# Patient Record
Sex: Male | Born: 1970 | Race: White | Hispanic: No | Marital: Married | Smoking: Never smoker
Health system: Southern US, Community
[De-identification: ages and names within clinical notes are randomized; demographics above are authoritative.]

## PROBLEM LIST (undated history)

## (undated) DIAGNOSIS — R109 Unspecified abdominal pain: Secondary | ICD-10-CM

## (undated) DIAGNOSIS — K219 Gastro-esophageal reflux disease without esophagitis: Secondary | ICD-10-CM

## (undated) HISTORY — PX: CHOLECYSTECTOMY: SHX55

---

## 1998-09-05 ENCOUNTER — Ambulatory Visit (HOSPITAL_COMMUNITY): Admission: RE | Admit: 1998-09-05 | Discharge: 1998-09-05 | Payer: Self-pay | Admitting: Gastroenterology

## 2007-11-17 ENCOUNTER — Encounter: Admission: RE | Admit: 2007-11-17 | Discharge: 2007-11-17 | Payer: Self-pay | Admitting: Gastroenterology

## 2009-08-05 ENCOUNTER — Emergency Department (HOSPITAL_BASED_OUTPATIENT_CLINIC_OR_DEPARTMENT_OTHER): Admission: EM | Admit: 2009-08-05 | Discharge: 2009-08-05 | Payer: Self-pay | Admitting: Emergency Medicine

## 2010-07-04 ENCOUNTER — Encounter: Admission: RE | Admit: 2010-07-04 | Discharge: 2010-07-04 | Payer: Self-pay | Admitting: Gastroenterology

## 2010-08-14 ENCOUNTER — Ambulatory Visit (HOSPITAL_COMMUNITY): Admission: RE | Admit: 2010-08-14 | Discharge: 2010-08-14 | Payer: Self-pay | Admitting: General Surgery

## 2011-01-25 ENCOUNTER — Other Ambulatory Visit (INDEPENDENT_AMBULATORY_CARE_PROVIDER_SITE_OTHER): Payer: Self-pay

## 2011-01-25 DIAGNOSIS — R0989 Other specified symptoms and signs involving the circulatory and respiratory systems: Secondary | ICD-10-CM

## 2011-03-08 LAB — CBC
HCT: 41.6 % (ref 39.0–52.0)
Hemoglobin: 14.6 g/dL (ref 13.0–17.0)
MCHC: 35.1 g/dL (ref 30.0–36.0)
Platelets: 203 10*3/uL (ref 150–400)
RBC: 4.63 MIL/uL (ref 4.22–5.81)
RDW: 12.8 % (ref 11.5–15.5)
WBC: 5.5 10*3/uL (ref 4.0–10.5)

## 2011-03-08 LAB — COMPREHENSIVE METABOLIC PANEL
Albumin: 4.4 g/dL (ref 3.5–5.2)
Alkaline Phosphatase: 42 U/L (ref 39–117)
CO2: 30 mEq/L (ref 19–32)
Calcium: 9.5 mg/dL (ref 8.4–10.5)
Creatinine, Ser: 0.75 mg/dL (ref 0.4–1.5)
Sodium: 139 mEq/L (ref 135–145)
Total Bilirubin: 1.6 mg/dL — ABNORMAL HIGH (ref 0.3–1.2)

## 2011-03-08 LAB — DIFFERENTIAL
Basophils Absolute: 0 10*3/uL (ref 0.0–0.1)
Eosinophils Relative: 1 % (ref 0–5)
Lymphocytes Relative: 39 % (ref 12–46)
Monocytes Relative: 10 % (ref 3–12)
Neutro Abs: 2.7 10*3/uL (ref 1.7–7.7)
Neutrophils Relative %: 49 % (ref 43–77)

## 2011-03-08 LAB — SURGICAL PCR SCREEN: MRSA, PCR: NEGATIVE

## 2011-05-10 ENCOUNTER — Encounter (INDEPENDENT_AMBULATORY_CARE_PROVIDER_SITE_OTHER): Payer: Self-pay | Admitting: *Deleted

## 2011-05-10 ENCOUNTER — Encounter: Payer: Self-pay | Admitting: Cardiology

## 2011-05-31 ENCOUNTER — Ambulatory Visit (HOSPITAL_COMMUNITY)
Admission: RE | Admit: 2011-05-31 | Discharge: 2011-05-31 | Disposition: A | Discharge status: Home / Self Care | Payer: Worker's Compensation | Source: Ambulatory Visit | Attending: Orthopedic Surgery | Admitting: Orthopedic Surgery

## 2011-05-31 ENCOUNTER — Other Ambulatory Visit (HOSPITAL_COMMUNITY): Payer: Self-pay | Admitting: Orthopedic Surgery

## 2011-05-31 DIAGNOSIS — Z77018 Contact with and (suspected) exposure to other hazardous metals: Secondary | ICD-10-CM

## 2011-05-31 DIAGNOSIS — Z1389 Encounter for screening for other disorder: Secondary | ICD-10-CM | POA: Insufficient documentation

## 2011-11-12 ENCOUNTER — Other Ambulatory Visit: Payer: Self-pay | Admitting: Gastroenterology

## 2011-11-12 DIAGNOSIS — R1011 Right upper quadrant pain: Secondary | ICD-10-CM

## 2011-11-19 ENCOUNTER — Ambulatory Visit
Admission: RE | Admit: 2011-11-19 | Discharge: 2011-11-19 | Disposition: A | Discharge status: Home / Self Care | Payer: 59 | Source: Ambulatory Visit | Attending: Gastroenterology | Admitting: Gastroenterology

## 2011-11-19 DIAGNOSIS — R1011 Right upper quadrant pain: Secondary | ICD-10-CM

## 2011-12-04 ENCOUNTER — Ambulatory Visit (HOSPITAL_COMMUNITY)
Admission: RE | Admit: 2011-12-04 | Discharge: 2011-12-04 | Disposition: A | Discharge status: Home / Self Care | Payer: 59 | Source: Ambulatory Visit | Attending: Gastroenterology | Admitting: Gastroenterology

## 2011-12-04 ENCOUNTER — Ambulatory Visit (HOSPITAL_COMMUNITY): Payer: 59 | Admitting: Anesthesiology

## 2011-12-04 ENCOUNTER — Encounter (HOSPITAL_COMMUNITY): Payer: Self-pay | Admitting: Anesthesiology

## 2011-12-04 ENCOUNTER — Encounter (HOSPITAL_COMMUNITY): Admission: RE | Disposition: A | Discharge status: Home / Self Care | Payer: Self-pay | Source: Ambulatory Visit

## 2011-12-04 ENCOUNTER — Encounter (HOSPITAL_COMMUNITY): Payer: Self-pay | Admitting: *Deleted

## 2011-12-04 DIAGNOSIS — R1011 Right upper quadrant pain: Secondary | ICD-10-CM | POA: Insufficient documentation

## 2011-12-04 DIAGNOSIS — R17 Unspecified jaundice: Secondary | ICD-10-CM | POA: Insufficient documentation

## 2011-12-04 DIAGNOSIS — Z9089 Acquired absence of other organs: Secondary | ICD-10-CM | POA: Insufficient documentation

## 2011-12-04 HISTORY — DX: Gastro-esophageal reflux disease without esophagitis: K21.9

## 2011-12-04 HISTORY — PX: EUS: SHX5427

## 2011-12-04 SURGERY — ESOPHAGEAL ENDOSCOPIC ULTRASOUND (EUS) RADIAL
Anesthesia: Monitor Anesthesia Care

## 2011-12-04 MED ORDER — MIDAZOLAM HCL 5 MG/5ML IJ SOLN
INTRAMUSCULAR | Status: DC | PRN
  Administered 2011-12-04: 1 mg via INTRAVENOUS
  Administered 2011-12-04: 2 mg via INTRAVENOUS
  Administered 2011-12-04: 1 mg via INTRAVENOUS

## 2011-12-04 MED ORDER — PROPOFOL 10 MG/ML IV EMUL
INTRAVENOUS | Status: DC | PRN
  Administered 2011-12-04: 120 ug/kg/min via INTRAVENOUS

## 2011-12-04 MED ORDER — LACTATED RINGERS IV SOLN
INTRAVENOUS | Status: DC
  Administered 2011-12-04: 1000 mL via INTRAVENOUS

## 2011-12-04 MED ORDER — FENTANYL CITRATE 0.05 MG/ML IJ SOLN
INTRAMUSCULAR | Status: DC | PRN
  Administered 2011-12-04: 100 ug via INTRAVENOUS

## 2011-12-04 MED ORDER — KETAMINE HCL 10 MG/ML IJ SOLN
INTRAMUSCULAR | Status: DC | PRN
  Administered 2011-12-04 (×2): 10 mg via INTRAVENOUS

## 2011-12-04 NOTE — Anesthesia Postprocedure Evaluation (Signed)
  Anesthesia Post-op Note  Patient: Joe Hunt  Procedure(s) Performed:  ESOPHAGEAL ENDOSCOPIC ULTRASOUND (EUS) RADIAL  Patient Location: PACU  Anesthesia Type: MAC  Level of Consciousness: awake and alert   Airway and Oxygen Therapy: Patient Spontanous Breathing  Post-op Pain: mild  Post-op Assessment: Post-op Vital signs reviewed, Patient's Cardiovascular Status Stable, Respiratory Function Stable, Patent Airway and No signs of Nausea or vomiting  Post-op Vital Signs: stable  Complications: No apparent anesthesia complications

## 2011-12-04 NOTE — Anesthesia Preprocedure Evaluation (Addendum)
Anesthesia Evaluation  Patient identified by MRN, date of birth, ID band Patient awake    Reviewed: Allergy & Precautions, H&P , NPO status , Patient's Chart, lab work & pertinent test results  Airway Mallampati: II TM Distance: >3 FB Neck ROM: Full    Dental No notable dental hx.    Pulmonary neg pulmonary ROS,  clear to auscultation  Pulmonary exam normal       Cardiovascular neg cardio ROS Regular Normal    Neuro/Psych Negative Neurological ROS  Negative Psych ROS   GI/Hepatic negative GI ROS, Neg liver ROS, GERD-  Medicated,  Endo/Other  Negative Endocrine ROS  Renal/GU negative Renal ROS  Genitourinary negative   Musculoskeletal negative musculoskeletal ROS (+)   Abdominal   Peds negative pediatric ROS (+)  Hematology negative hematology ROS (+)   Anesthesia Other Findings   Reproductive/Obstetrics negative OB ROS                           Anesthesia Physical Anesthesia Plan  ASA: II  Anesthesia Plan: MAC   Post-op Pain Management:    Induction: Intravenous  Airway Management Planned: Nasal Cannula  Additional Equipment:   Intra-op Plan:   Post-operative Plan: Extubation in OR  Informed Consent: I have reviewed the patients History and Physical, chart, labs and discussed the procedure including the risks, benefits and alternatives for the proposed anesthesia with the patient or authorized representative who has indicated his/her understanding and acceptance.   Dental advisory given  Plan Discussed with: CRNA  Anesthesia Plan Comments:         Anesthesia Quick Evaluation

## 2011-12-04 NOTE — Op Note (Signed)
[facility] [address] [place], [place]  [address]  ENDOSCOPIC ULTRASOUND PROCEDURE REPORT  PATIENT:  Joe, Hunt  MR#:  [id number] BIRTHDATE:  [birth year]  GENDER:  male  ENDOSCOPIST:  Willis Modena, MD REFERRED BY:  Bernette Redbird, M.D.  PROCEDURE DATE:  12/04/2011 PROCEDURE:  Upper EUS ASA CLASS:  Class II INDICATIONS:  right upper quadrant abdominal pain, mild hyperbilirubinema, post-cholecystectomy  MEDICATIONS:   MAC sedation, administered by CRNA, Cetacaine spray x 2  DESCRIPTION OF PROCEDURE:   After the risks benefits and alternatives of the procedure were  explained, informed consent was obtained. The patient was then placed in the left, lateral, decubitus postion and IV sedation was administered. Throughout the procedure, the patient's blood pressure, pulse and oxygen saturations were monitored continuously.  Under direct visualization, the  endoscope was introduced through the mouth and advanced to the second portion of the duodenum.  Water was used as necessary to provide an acoustic interface.  Upon completion of the imaging, water was removed and the patient was sent to the recovery room in satisfactory condition.  <<PROCEDUREIMAGES>>  FINDINGS:  Normal pancreatic parenchyma of head, uncinate, genu, body, and tail.  Post-cholecystectomy.  CBD prominent 9-10 mm.  No bile duct wall thickening and no choledocholithiasis seen. Ampulla normal via EUS.  No pancreatic mass seen.  ENDOSCOPIC IMPRESSION:      1.  Normal pancreas; no evidence of chronic pancreatitis. 2.  Prominent bile duct; no mass or choledocholithiasis seen. 3.  No explanation of patient's right upper quadrant pain was identified.  RECOMMENDATIONS:        1.  Watch for potential complications of procedure. 2.  Will discuss with Dr. Matthias Hughs.  ______________________________ Willis Modena  CC:  n. eSIGNED:   Willis Modena at 12/04/2011 10:33 AM  Rolla Flatten,  [phone]

## 2011-12-04 NOTE — Transfer of Care (Signed)
Immediate Anesthesia Transfer of Care Note  Patient: Joe Hunt  Procedure(s) Performed:  ESOPHAGEAL ENDOSCOPIC ULTRASOUND (EUS) RADIAL  Patient Location: PACU and Endoscopy Unit  Anesthesia Type: MAC  Level of Consciousness: sedated  Airway & Oxygen Therapy: Patient Spontanous Breathing and Patient connected to nasal cannula oxygen  Post-op Assessment: Report given to PACU RN and Post -op Vital signs reviewed and stable  Post vital signs: Reviewed and stable  Complications: No apparent anesthesia complications

## 2011-12-04 NOTE — H&P (Signed)
Patient interval history reviewed.  Patient examined again.  There has been no change from documented H/P dated 11/11/2011 (scanned into chart from our office) except as documented above.

## 2011-12-18 ENCOUNTER — Encounter (HOSPITAL_COMMUNITY): Payer: Self-pay | Admitting: Gastroenterology

## 2012-10-21 ENCOUNTER — Other Ambulatory Visit: Payer: Self-pay | Admitting: Dermatology

## 2013-06-03 IMAGING — CR DG CHEST 1V
1 series · 1 of 1 positions shown · non-contrast
Comparison: None.

CLINICAL DATA: No history given

CHEST - 1 VIEW

[PA]
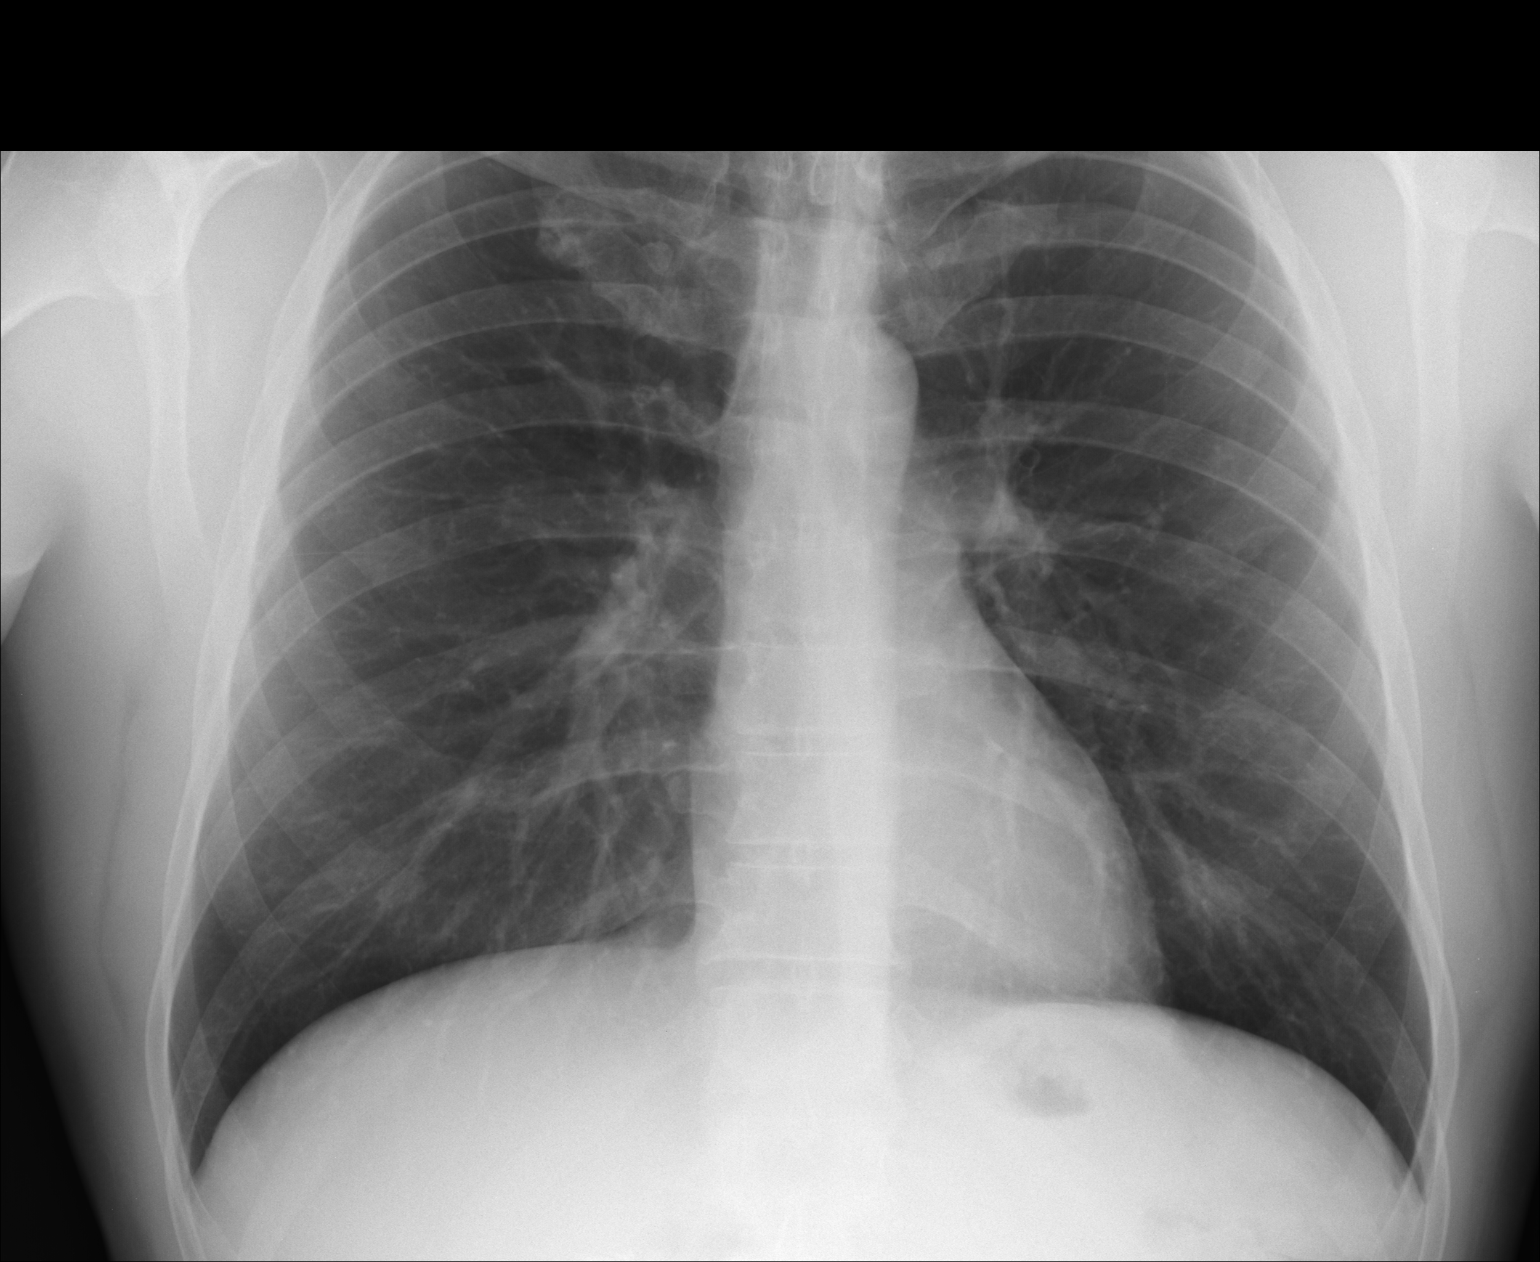

[1 of 1 positions shown; findings below may reference images not displayed]

FINDINGS: The lungs are clear.  Mediastinal contours appear normal.
The heart is within normal limits in size.  No bony abnormality is
seen.
IMPRESSION: No active lung disease.

## 2013-11-10 ENCOUNTER — Other Ambulatory Visit: Payer: Self-pay | Admitting: Dermatology

## 2014-07-20 ENCOUNTER — Encounter (HOSPITAL_COMMUNITY): Payer: Self-pay | Admitting: Pharmacy Technician

## 2014-07-26 ENCOUNTER — Encounter (HOSPITAL_COMMUNITY): Payer: Self-pay | Admitting: *Deleted

## 2014-08-02 ENCOUNTER — Other Ambulatory Visit: Payer: Self-pay | Admitting: Gastroenterology

## 2014-08-02 NOTE — Addendum Note (Signed)
Addended by: Dachelle Molzahn on: 08/02/2014 04:25 PM   Modules accepted: Orders  

## 2014-08-03 ENCOUNTER — Ambulatory Visit (HOSPITAL_COMMUNITY)
Admission: RE | Admit: 2014-08-03 | Discharge: 2014-08-03 | Disposition: A | Discharge status: Home / Self Care | Payer: 59 | Source: Ambulatory Visit | Attending: Gastroenterology | Admitting: Gastroenterology

## 2014-08-03 ENCOUNTER — Ambulatory Visit (HOSPITAL_COMMUNITY): Payer: 59 | Admitting: Anesthesiology

## 2014-08-03 ENCOUNTER — Encounter (HOSPITAL_COMMUNITY)
Admission: RE | Disposition: A | Discharge status: Home / Self Care | Payer: Self-pay | Source: Ambulatory Visit | Attending: Gastroenterology

## 2014-08-03 ENCOUNTER — Encounter (HOSPITAL_COMMUNITY): Payer: 59 | Admitting: Anesthesiology

## 2014-08-03 ENCOUNTER — Encounter (HOSPITAL_COMMUNITY): Payer: Self-pay | Admitting: *Deleted

## 2014-08-03 DIAGNOSIS — R7989 Other specified abnormal findings of blood chemistry: Secondary | ICD-10-CM | POA: Diagnosis not present

## 2014-08-03 DIAGNOSIS — K219 Gastro-esophageal reflux disease without esophagitis: Secondary | ICD-10-CM | POA: Diagnosis not present

## 2014-08-03 DIAGNOSIS — R634 Abnormal weight loss: Secondary | ICD-10-CM | POA: Insufficient documentation

## 2014-08-03 DIAGNOSIS — R1013 Epigastric pain: Secondary | ICD-10-CM | POA: Diagnosis not present

## 2014-08-03 DIAGNOSIS — R1012 Left upper quadrant pain: Secondary | ICD-10-CM | POA: Insufficient documentation

## 2014-08-03 DIAGNOSIS — R109 Unspecified abdominal pain: Secondary | ICD-10-CM | POA: Diagnosis present

## 2014-08-03 HISTORY — DX: Unspecified abdominal pain: R10.9

## 2014-08-03 HISTORY — PX: EUS: SHX5427

## 2014-08-03 HISTORY — PX: ESOPHAGOGASTRODUODENOSCOPY (EGD) WITH PROPOFOL: SHX5813

## 2014-08-03 SURGERY — ESOPHAGOGASTRODUODENOSCOPY (EGD) WITH PROPOFOL
Anesthesia: Monitor Anesthesia Care

## 2014-08-03 MED ORDER — LACTATED RINGERS IV SOLN
INTRAVENOUS | Status: DC
  Administered 2014-08-03: 1000 mL via INTRAVENOUS

## 2014-08-03 MED ORDER — FENTANYL CITRATE 0.05 MG/ML IJ SOLN
INTRAMUSCULAR | Status: DC | PRN
Start: 2014-08-03 — End: 2014-08-03
  Administered 2014-08-03 (×2): 50 ug via INTRAVENOUS

## 2014-08-03 MED ORDER — MIDAZOLAM HCL 2 MG/2ML IJ SOLN
INTRAMUSCULAR | Status: AC
  Filled 2014-08-03: qty 2

## 2014-08-03 MED ORDER — FENTANYL CITRATE 0.05 MG/ML IJ SOLN
INTRAMUSCULAR | Status: AC
  Filled 2014-08-03: qty 2

## 2014-08-03 MED ORDER — SODIUM CHLORIDE 0.9 % IV SOLN: INTRAVENOUS | Status: DC

## 2014-08-03 MED ORDER — GLYCOPYRROLATE 0.2 MG/ML IJ SOLN
INTRAMUSCULAR | Status: AC
  Filled 2014-08-03: qty 1

## 2014-08-03 MED ORDER — MIDAZOLAM HCL 5 MG/5ML IJ SOLN
INTRAMUSCULAR | Status: DC | PRN
  Administered 2014-08-03: 2 mg via INTRAVENOUS

## 2014-08-03 MED ORDER — BUTAMBEN-TETRACAINE-BENZOCAINE 2-2-14 % EX AERO
INHALATION_SPRAY | CUTANEOUS | Status: DC | PRN
  Administered 2014-08-03: 2 via TOPICAL

## 2014-08-03 MED ORDER — PROPOFOL INFUSION 10 MG/ML OPTIME
INTRAVENOUS | Status: DC | PRN
  Administered 2014-08-03: 140 ug/kg/min via INTRAVENOUS

## 2014-08-03 MED ORDER — PROPOFOL 10 MG/ML IV BOLUS
INTRAVENOUS | Status: AC
  Filled 2014-08-03: qty 20

## 2014-08-03 MED ORDER — PROPOFOL 10 MG/ML IV BOLUS
INTRAVENOUS | Status: AC
Start: 2014-08-03 — End: 2014-08-03
  Filled 2014-08-03: qty 20

## 2014-08-03 MED ORDER — LIDOCAINE HCL (CARDIAC) 20 MG/ML IV SOLN
INTRAVENOUS | Status: AC
  Filled 2014-08-03: qty 5

## 2014-08-03 MED ORDER — GLYCOPYRROLATE 0.2 MG/ML IJ SOLN
INTRAMUSCULAR | Status: DC | PRN
  Administered 2014-08-03: 0.2 mg via INTRAVENOUS

## 2014-08-03 MED ORDER — LIDOCAINE HCL (CARDIAC) 20 MG/ML IV SOLN
INTRAVENOUS | Status: DC | PRN
  Administered 2014-08-03: 100 mg via INTRAVENOUS

## 2014-08-03 SURGICAL SUPPLY — 15 items

## 2014-08-03 NOTE — H&P (Signed)
Patient interval history reviewed.  Patient examined again.  There has been no change from documented H/P dated 07/18/14 (scanned into chart from our office) except as documented above.  Assessment:  1.  Epigastric and left upper quadrant abdominal pain. 2.  Weight loss.  Plan:  1.  Endoscopy. 2.  Upper endoscopic ultrasound with possible fine needle aspiration biopsies. 3.  Risks (bleeding, infection, bowel perforation that could require surgery, sedation-related changes in cardiopulmonary systems), benefits (identification and possible treatment of source of symptoms, exclusion of certain causes of symptoms), and alternatives (watchful waiting, radiographic imaging studies, empiric medical treatment) of upper endoscopy and upper endoscopic ultrasound with possible biopsies (EGD + EUS +/- FNA) were explained to patient/family in detail and patient wishes to proceed.

## 2014-08-03 NOTE — Anesthesia Preprocedure Evaluation (Signed)
Anesthesia Evaluation  Patient identified by MRN, date of birth, ID band Patient awake    Reviewed: Allergy & Precautions, H&P , NPO status , Patient's Chart, lab work & pertinent test results  Airway Mallampati: II TM Distance: >3 FB Neck ROM: full    Dental no notable dental hx. (+) Dental Advisory Given, Chipped Bonding and small chip left upper front:   Pulmonary neg pulmonary ROS,  breath sounds clear to auscultation  Pulmonary exam normal       Cardiovascular Exercise Tolerance: Good negative cardio ROS  Rhythm:regular Rate:Normal     Neuro/Psych negative neurological ROS  negative psych ROS   GI/Hepatic negative GI ROS, Neg liver ROS, GERD-  Medicated and Controlled,  Endo/Other  negative endocrine ROS  Renal/GU negative Renal ROS  negative genitourinary   Musculoskeletal   Abdominal   Peds  Hematology negative hematology ROS (+)   Anesthesia Other Findings   Reproductive/Obstetrics negative OB ROS                           Anesthesia Physical Anesthesia Plan  ASA: I  Anesthesia Plan: MAC   Post-op Pain Management:    Induction:   Airway Management Planned:   Additional Equipment:   Intra-op Plan:   Post-operative Plan:   Informed Consent: I have reviewed the patients History and Physical, chart, labs and discussed the procedure including the risks, benefits and alternatives for the proposed anesthesia with the patient or authorized representative who has indicated his/her understanding and acceptance.   Dental Advisory Given  Plan Discussed with: CRNA and Surgeon  Anesthesia Plan Comments:         Anesthesia Quick Evaluation

## 2014-08-03 NOTE — Anesthesia Postprocedure Evaluation (Signed)
  Anesthesia Post-op Note  Patient: Joe Hunt  Procedure(s) Performed: Procedure(s) (LRB): ESOPHAGOGASTRODUODENOSCOPY (EGD) WITH PROPOFOL (N/A) ESOPHAGEAL ENDOSCOPIC ULTRASOUND (EUS) RADIAL (N/A)  Patient Location: PACU  Anesthesia Type: MAC  Level of Consciousness: awake and alert   Airway and Oxygen Therapy: Patient Spontanous Breathing  Post-op Pain: mild  Post-op Assessment: Post-op Vital signs reviewed, Patient's Cardiovascular Status Stable, Respiratory Function Stable, Patent Airway and No signs of Nausea or vomiting  Last Vitals:  Filed Vitals:   08/03/14 1255  BP: 102/55  Pulse: 42  Temp:   Resp: 15    Post-op Vital Signs: stable   Complications: No apparent anesthesia complications

## 2014-08-03 NOTE — Discharge Instructions (Addendum)

## 2014-08-03 NOTE — Transfer of Care (Signed)
Immediate Anesthesia Transfer of Care Note  Patient: Joe Hunt  Procedure(s) Performed: Procedure(s): ESOPHAGOGASTRODUODENOSCOPY (EGD) WITH PROPOFOL (N/A) ESOPHAGEAL ENDOSCOPIC ULTRASOUND (EUS) RADIAL (N/A)  Patient Location: PACU  Anesthesia Type:MAC  Level of Consciousness: sedated  Airway & Oxygen Therapy: Patient Spontanous Breathing and Patient connected to nasal cannula oxygen  Post-op Assessment: Report given to PACU RN and Post -op Vital signs reviewed and stable  Post vital signs: Reviewed and stable  Complications: No apparent anesthesia complications

## 2014-08-03 NOTE — Op Note (Signed)
[facility] [address] [place], [id number]   ENDOSCOPIC ULTRASOUND PROCEDURE REPORT  PATIENT: Joe Hunt, Joe F.  MR#: [id number] BIRTHDATE: January 03, 1971  GENDER: Male ENDOSCOPIST: Willis ModenaWilliam Hercules Hasler, MD REFERRED BY:  Bernette Redbirdobert Buccini, M.D. PROCEDURE DATE:  08/03/2014 PROCEDURE:   Upper EUS ASA CLASS:      Class I INDICATIONS:   1.  upper abdominal pain, weight loss, elevated LFTs.  MEDICATIONS: MAC sedation, administered by CRNA  DESCRIPTION OF PROCEDURE:   After the risks benefits and alternatives of the procedure were  explained, informed consent was obtained. The patient was then placed in the left, lateral, decubitus postion and IV sedation was administered. Throughout the procedure, the patients blood pressure, pulse and oxygen saturations were monitored continuously.  Under direct visualization, the forward-viewing radial echoendoscope was introduced through the mouth  and advanced to the second portion of the duodenum .  Water was used as necessary to provide an acoustic interface.  Upon completion of the imaging, water was removed and the patient was sent to the recovery room in satisfactory condition.    FINDINGS:      EGD:  Small proximal esophageal gastric inlet patch, otherwise normal esophagus.  Mild antral gastritis, otherwise normal stomach, including normal retroflexed views into the cardia. Normal pylorus.  Normal duodenum to the second portion. EUS:  Normal-appearing pancreatic parenchyma of the head, uncinate, genu, body and tail.  No pancreatic mass, cyst, or features of chronic pancreatitis were identified.   Bile duct   7 mm post-cholecystectomy; no bile duct wall thickening, mass, or choledocholithiasis identified.  Normal-appearing ampulla via EUS. No peripancreatic adenopathy.  Normal-appearing limited views of left lobe of liver.  IMPRESSION:     As above.  No explanation for the patient's GI symptoms were identified on today's  examination.  RECOMMENDATIONS:     1.  Watch for potential complications of procedure. 2.  Might consider cross-sectional imaging for further investigation into patient's symptoms. 3.  Follow-up with Dr. Matthias HughsBuccini for ongoing management of GI symptoms.   _______________________________ Rosalie DoctoreSignedWillis Modena:  Cicilia Clinger, MD 08/03/2014 12:16 PM   CC:

## 2014-08-04 ENCOUNTER — Encounter (HOSPITAL_COMMUNITY): Payer: Self-pay | Admitting: Gastroenterology

## 2019-08-05 ENCOUNTER — Encounter: Payer: 59 | Admitting: Diagnostic Neuroimaging

## 2019-08-05 ENCOUNTER — Ambulatory Visit (INDEPENDENT_AMBULATORY_CARE_PROVIDER_SITE_OTHER): Payer: 59 | Admitting: Diagnostic Neuroimaging

## 2019-08-05 ENCOUNTER — Other Ambulatory Visit: Payer: Self-pay

## 2019-08-05 DIAGNOSIS — Z0289 Encounter for other administrative examinations: Secondary | ICD-10-CM

## 2019-08-05 DIAGNOSIS — M79604 Pain in right leg: Secondary | ICD-10-CM

## 2019-08-05 NOTE — Procedures (Addendum)
   [facility]  NCS (NERVE CONDUCTION STUDY) WITH EMG (ELECTROMYOGRAPHY) REPORT   STUDY DATE: 08/05/19 PATIENT NAME: Joe Hunt DOB: 1971-05-19 MRN: [id number]  ORDERING CLINICIAN: Hyman Bower  TECHNOLOGIST: Sherre Scarlet ELECTROMYOGRAPHER: Earlean Polka. Ankit Degregorio, MD  CLINICAL INFORMATION: 48 year old male with right low back pain radiating to the right foot.  FINDINGS: NERVE CONDUCTION STUDY: Right peroneal and right tibial motor responses are normal. Right superficial peroneal and left sural sensory responses normal. Right sural sensory response has normal peak latency and slightly decreased amplitude. Right tibial F wave latency is normal.  NEEDLE ELECTROMYOGRAPHY: Needle examination of right lower extremity vastus medialis, tibialis anterior, gastrocnemius, gluteus medius, right lumbar paraspinal muscles is normal.    IMPRESSION:   This study demonstrates: -Borderline mild right sural neuropathy. -No underlying evidence of right lumbar radiculopathy.   INTERPRETING PHYSICIAN:  Penni Bombard, MD Certified in Neurology, Neurophysiology and Neuroimaging  [facility] [address] [place],  [address] [phone]    [facility]    Nerve / Sites Muscle Latency Ref. Amplitude Ref. Rel Amp Segments Distance Velocity Ref. Area    ms ms mV mV %  cm m/s m/s mVms  R Peroneal - EDB     Ankle EDB 4.1 ?6.5 5.6 ?2.0 100 Ankle - EDB 9   17.1     Fib head EDB 10.7  5.2  93.2 Fib head - Ankle 32 49 ?44 16.2     Pop fossa EDB 12.9  5.0  96.6 Pop fossa - Fib head 10 45 ?44 15.7         Pop fossa - Ankle      R Tibial - AH     Ankle AH 3.3 ?5.8 8.4 ?4.0 100 Ankle - AH 9   14.8     Pop fossa AH 11.6  5.2  61.4 Pop fossa - Ankle 40 48 ?41 11.8         SNC    Nerve / Sites Rec. Site Peak Lat Ref.  Amp Ref. Segments Distance    ms ms V V  cm  R Sural - Ankle (Calf)     Calf Ankle 3.6 ?4.4 5 ?6 Calf - Ankle 14  L Sural - Ankle  (Calf)     Calf Ankle 4.0 ?4.4 9 ?6 Calf - Ankle 14  R Superficial peroneal - Ankle     Lat leg Ankle 3.8 ?4.4 8 ?6 Lat leg - Ankle 14           F  Wave    Nerve F Lat Ref.   ms ms  R Tibial - AH 51.9 ?56.0       EMG full       EMG Summary Table    Spontaneous MUAP Recruitment  Muscle IA Fib PSW Fasc Other Amp Dur. Poly Pattern  R. Tibialis anterior Normal None None None _______ Normal Normal Normal Normal  R. Gastrocnemius (Medial head) Normal None None None _______ Normal Normal Normal Normal  R. Vastus medialis Normal None None None _______ Normal Normal Normal Normal  R. Gluteus medius Normal None None None _______ Normal Normal Normal Normal  R. Lumbar paraspinals Normal None None None _______ Normal Normal Normal Normal

## 2022-10-30 ENCOUNTER — Other Ambulatory Visit (HOSPITAL_COMMUNITY): Payer: Self-pay | Admitting: Gastroenterology

## 2022-10-30 DIAGNOSIS — R1084 Generalized abdominal pain: Secondary | ICD-10-CM

## 2022-11-03 ENCOUNTER — Ambulatory Visit (HOSPITAL_COMMUNITY)
Admission: RE | Admit: 2022-11-03 | Discharge: 2022-11-03 | Disposition: A | Discharge status: Home / Self Care | Payer: 59 | Source: Ambulatory Visit | Attending: Gastroenterology | Admitting: Gastroenterology

## 2022-11-03 DIAGNOSIS — R1084 Generalized abdominal pain: Secondary | ICD-10-CM | POA: Insufficient documentation

## 2022-11-03 MED ORDER — IOHEXOL 300 MG/ML  SOLN
100.0000 mL | Freq: Once | INTRAMUSCULAR | Status: AC | PRN
  Administered 2022-11-03: 100 mL via INTRAVENOUS

## 2023-07-09 ENCOUNTER — Other Ambulatory Visit: Payer: Self-pay | Admitting: Family Medicine

## 2023-07-09 DIAGNOSIS — Z136 Encounter for screening for cardiovascular disorders: Secondary | ICD-10-CM

## 2023-07-17 ENCOUNTER — Ambulatory Visit
Admission: RE | Admit: 2023-07-17 | Discharge: 2023-07-17 | Disposition: A | Discharge status: Home / Self Care | Payer: 59 | Source: Ambulatory Visit | Attending: Family Medicine | Admitting: Family Medicine

## 2023-07-17 DIAGNOSIS — Z136 Encounter for screening for cardiovascular disorders: Secondary | ICD-10-CM

## 2023-10-02 ENCOUNTER — Other Ambulatory Visit: Payer: Self-pay | Admitting: Medical Genetics

## 2023-10-02 DIAGNOSIS — Z006 Encounter for examination for normal comparison and control in clinical research program: Secondary | ICD-10-CM

## 2024-10-14 ENCOUNTER — Other Ambulatory Visit: Payer: Self-pay | Admitting: Medical Genetics

## 2024-10-14 DIAGNOSIS — Z006 Encounter for examination for normal comparison and control in clinical research program: Secondary | ICD-10-CM
